# Patient Record
Sex: Male | Born: 1945 | Race: White | Hispanic: No | Smoking: Never smoker
Health system: Southern US, Community
[De-identification: ages and names within clinical notes are randomized; demographics above are authoritative.]

## PROBLEM LIST (undated history)

## (undated) DIAGNOSIS — M1990 Unspecified osteoarthritis, unspecified site: Secondary | ICD-10-CM

## (undated) DIAGNOSIS — I10 Essential (primary) hypertension: Secondary | ICD-10-CM

## (undated) DIAGNOSIS — N400 Enlarged prostate without lower urinary tract symptoms: Secondary | ICD-10-CM

## (undated) DIAGNOSIS — E785 Hyperlipidemia, unspecified: Secondary | ICD-10-CM

## (undated) DIAGNOSIS — N281 Cyst of kidney, acquired: Secondary | ICD-10-CM

## (undated) DIAGNOSIS — C801 Malignant (primary) neoplasm, unspecified: Secondary | ICD-10-CM

## (undated) HISTORY — PX: TONSILLECTOMY: SUR1361

## (undated) HISTORY — DX: Unspecified osteoarthritis, unspecified site: M19.90

## (undated) HISTORY — PX: OTHER SURGICAL HISTORY: SHX169

## (undated) HISTORY — DX: Cyst of kidney, acquired: N28.1

## (undated) HISTORY — PX: CERVICAL FUSION: SHX112

## (undated) HISTORY — DX: Malignant (primary) neoplasm, unspecified: C80.1

## (undated) HISTORY — DX: Benign prostatic hyperplasia without lower urinary tract symptoms: N40.0

## (undated) HISTORY — DX: Hyperlipidemia, unspecified: E78.5

## (undated) HISTORY — DX: Essential (primary) hypertension: I10

---

## 2004-04-22 ENCOUNTER — Ambulatory Visit (HOSPITAL_COMMUNITY): Admission: RE | Admit: 2004-04-22 | Discharge: 2004-04-22 | Payer: Self-pay | Admitting: Gastroenterology

## 2006-02-03 ENCOUNTER — Ambulatory Visit: Payer: Self-pay | Admitting: Internal Medicine

## 2006-02-03 LAB — CONVERTED CEMR LAB
BUN: 11 mg/dL (ref 6–23)
Basophils Absolute: 0 10*3/uL (ref 0.0–0.1)
CO2: 30 meq/L (ref 19–32)
Calcium: 9.1 mg/dL (ref 8.4–10.5)
Cholesterol: 151 mg/dL (ref 0–200)
Creatinine, Ser: 1.1 mg/dL (ref 0.4–1.5)
Eosinophils Relative: 2.5 % (ref 0.0–5.0)
Hemoglobin: 15.4 g/dL (ref 13.0–17.0)
Lymphocytes Relative: 22.2 % (ref 12.0–46.0)
MCHC: 33.1 g/dL (ref 30.0–36.0)
Monocytes Relative: 7.4 % (ref 3.0–11.0)
Neutrophils Relative %: 67.7 % (ref 43.0–77.0)
Potassium: 4 meq/L (ref 3.5–5.1)
TSH: 1.17 microintl units/mL (ref 0.35–5.50)
Total CHOL/HDL Ratio: 3.9
Total Protein: 6.7 g/dL (ref 6.0–8.3)
Triglycerides: 72 mg/dL (ref 0–149)

## 2006-05-09 ENCOUNTER — Emergency Department (HOSPITAL_COMMUNITY): Admission: EM | Admit: 2006-05-09 | Discharge: 2006-05-09 | Payer: Self-pay | Admitting: Emergency Medicine

## 2006-06-11 DIAGNOSIS — Z9089 Acquired absence of other organs: Secondary | ICD-10-CM | POA: Insufficient documentation

## 2006-06-11 DIAGNOSIS — Z8719 Personal history of other diseases of the digestive system: Secondary | ICD-10-CM

## 2006-06-11 DIAGNOSIS — E785 Hyperlipidemia, unspecified: Secondary | ICD-10-CM

## 2006-06-11 DIAGNOSIS — S6990XA Unspecified injury of unspecified wrist, hand and finger(s), initial encounter: Secondary | ICD-10-CM | POA: Insufficient documentation

## 2006-06-11 DIAGNOSIS — S6980XA Other specified injuries of unspecified wrist, hand and finger(s), initial encounter: Secondary | ICD-10-CM

## 2009-10-02 ENCOUNTER — Observation Stay: Payer: Self-pay | Admitting: Internal Medicine

## 2009-10-15 ENCOUNTER — Emergency Department: Payer: Self-pay | Admitting: Emergency Medicine

## 2009-11-21 ENCOUNTER — Ambulatory Visit: Payer: Self-pay | Admitting: Urology

## 2013-04-26 ENCOUNTER — Ambulatory Visit: Payer: Self-pay | Admitting: Urology

## 2014-05-22 DIAGNOSIS — R7309 Other abnormal glucose: Secondary | ICD-10-CM | POA: Diagnosis not present

## 2014-05-22 DIAGNOSIS — I10 Essential (primary) hypertension: Secondary | ICD-10-CM | POA: Diagnosis not present

## 2014-05-22 DIAGNOSIS — E780 Pure hypercholesterolemia: Secondary | ICD-10-CM | POA: Diagnosis not present

## 2014-05-31 DIAGNOSIS — E780 Pure hypercholesterolemia: Secondary | ICD-10-CM | POA: Diagnosis not present

## 2014-05-31 DIAGNOSIS — Z0000 Encounter for general adult medical examination without abnormal findings: Secondary | ICD-10-CM | POA: Diagnosis not present

## 2014-05-31 DIAGNOSIS — I10 Essential (primary) hypertension: Secondary | ICD-10-CM | POA: Diagnosis not present

## 2014-06-08 DIAGNOSIS — Z0000 Encounter for general adult medical examination without abnormal findings: Secondary | ICD-10-CM | POA: Diagnosis not present

## 2014-11-29 DIAGNOSIS — E7800 Pure hypercholesterolemia, unspecified: Secondary | ICD-10-CM | POA: Diagnosis not present

## 2014-11-29 DIAGNOSIS — I10 Essential (primary) hypertension: Secondary | ICD-10-CM | POA: Diagnosis not present

## 2014-12-04 DIAGNOSIS — I10 Essential (primary) hypertension: Secondary | ICD-10-CM | POA: Diagnosis not present

## 2014-12-04 DIAGNOSIS — E7800 Pure hypercholesterolemia, unspecified: Secondary | ICD-10-CM | POA: Diagnosis not present

## 2014-12-04 DIAGNOSIS — M25552 Pain in left hip: Secondary | ICD-10-CM | POA: Diagnosis not present

## 2015-05-28 DIAGNOSIS — Z125 Encounter for screening for malignant neoplasm of prostate: Secondary | ICD-10-CM | POA: Diagnosis not present

## 2015-05-28 DIAGNOSIS — I10 Essential (primary) hypertension: Secondary | ICD-10-CM | POA: Diagnosis not present

## 2015-05-28 DIAGNOSIS — Z0000 Encounter for general adult medical examination without abnormal findings: Secondary | ICD-10-CM | POA: Diagnosis not present

## 2015-05-28 DIAGNOSIS — E7800 Pure hypercholesterolemia, unspecified: Secondary | ICD-10-CM | POA: Diagnosis not present

## 2015-06-04 DIAGNOSIS — R7303 Prediabetes: Secondary | ICD-10-CM | POA: Diagnosis not present

## 2015-06-04 DIAGNOSIS — Z0000 Encounter for general adult medical examination without abnormal findings: Secondary | ICD-10-CM | POA: Diagnosis not present

## 2015-06-04 DIAGNOSIS — M5416 Radiculopathy, lumbar region: Secondary | ICD-10-CM | POA: Diagnosis not present

## 2015-06-04 DIAGNOSIS — E7800 Pure hypercholesterolemia, unspecified: Secondary | ICD-10-CM | POA: Diagnosis not present

## 2015-06-04 DIAGNOSIS — L989 Disorder of the skin and subcutaneous tissue, unspecified: Secondary | ICD-10-CM | POA: Diagnosis not present

## 2015-06-04 DIAGNOSIS — I10 Essential (primary) hypertension: Secondary | ICD-10-CM | POA: Diagnosis not present

## 2015-06-04 DIAGNOSIS — Z23 Encounter for immunization: Secondary | ICD-10-CM | POA: Diagnosis not present

## 2015-06-11 DIAGNOSIS — Z0000 Encounter for general adult medical examination without abnormal findings: Secondary | ICD-10-CM | POA: Diagnosis not present

## 2015-06-26 DIAGNOSIS — L989 Disorder of the skin and subcutaneous tissue, unspecified: Secondary | ICD-10-CM | POA: Diagnosis not present

## 2015-06-26 DIAGNOSIS — C44612 Basal cell carcinoma of skin of right upper limb, including shoulder: Secondary | ICD-10-CM | POA: Diagnosis not present

## 2015-11-01 IMAGING — CR DG ABDOMEN 1V
1 series · 2 of 2 positions shown · non-contrast
Comparison: CT abdomen and pelvis 11/21/2009

CLINICAL DATA: History kidney stones

EXAM:
ABDOMEN - 1 VIEW

[Series 1: t bladder spot · 0.14mm/px · 2 of 2 slices shown]
[im 1/2]
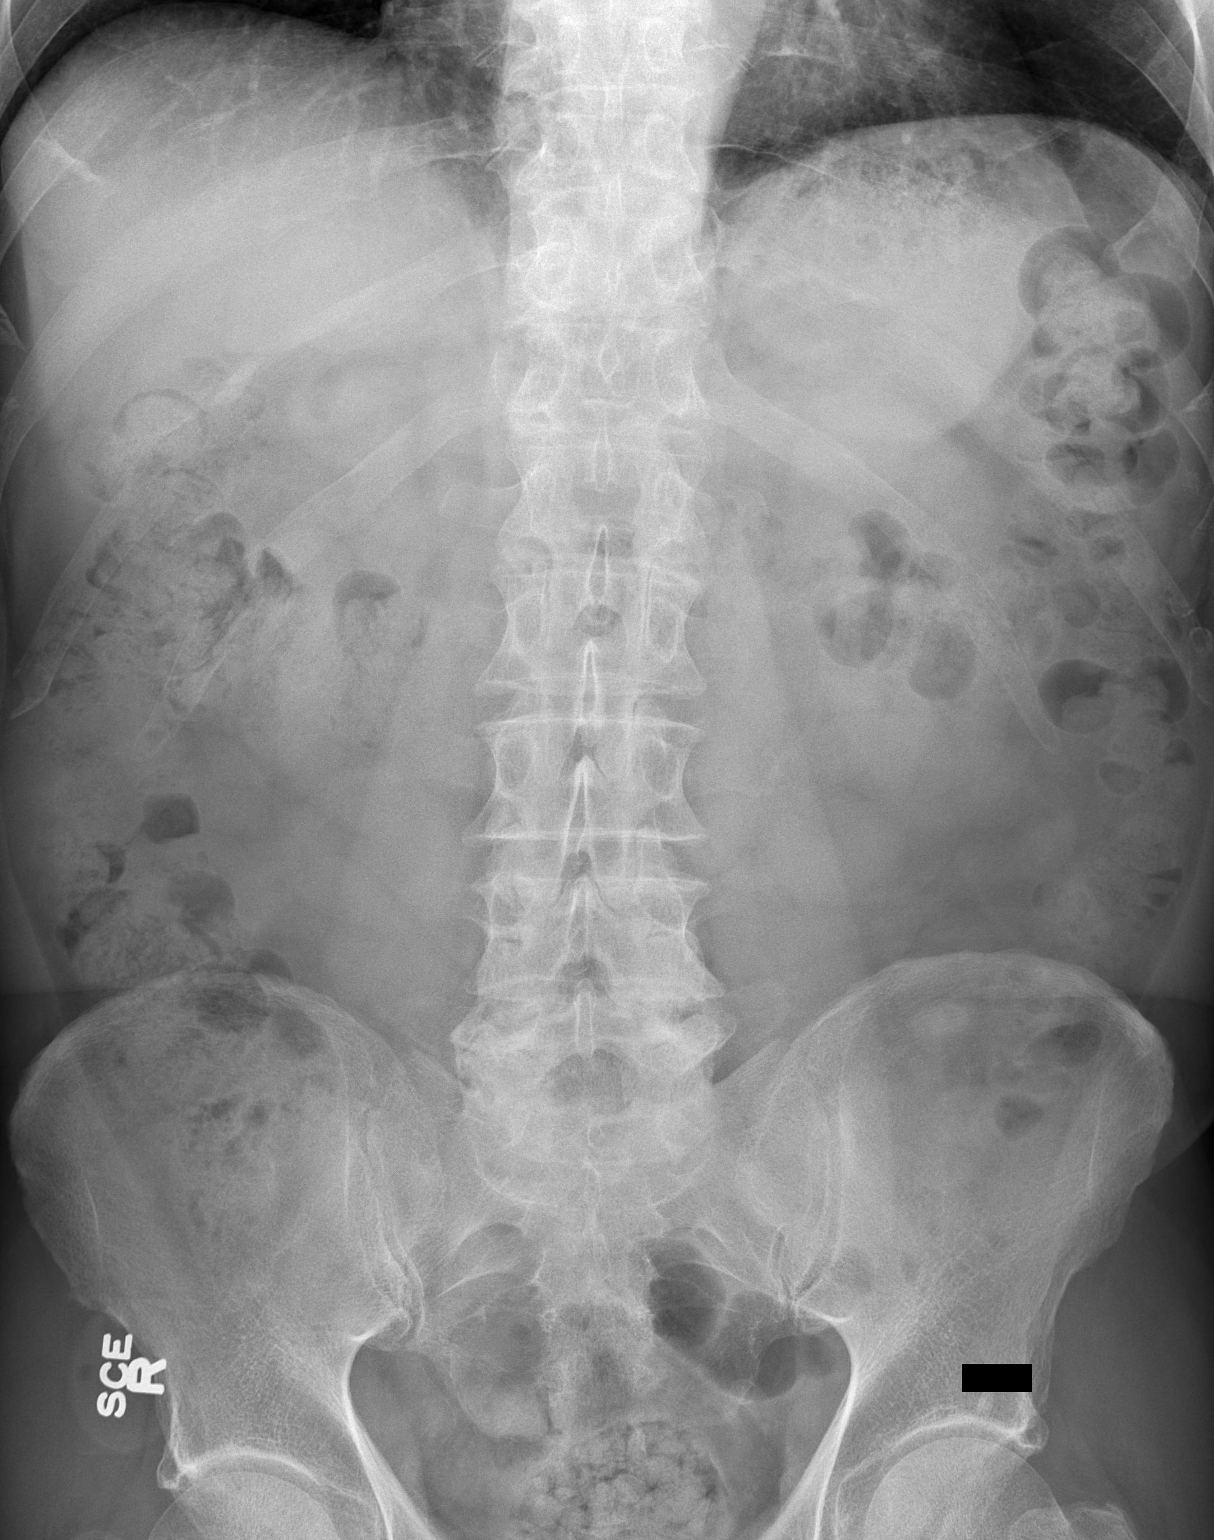
[im 2/2]
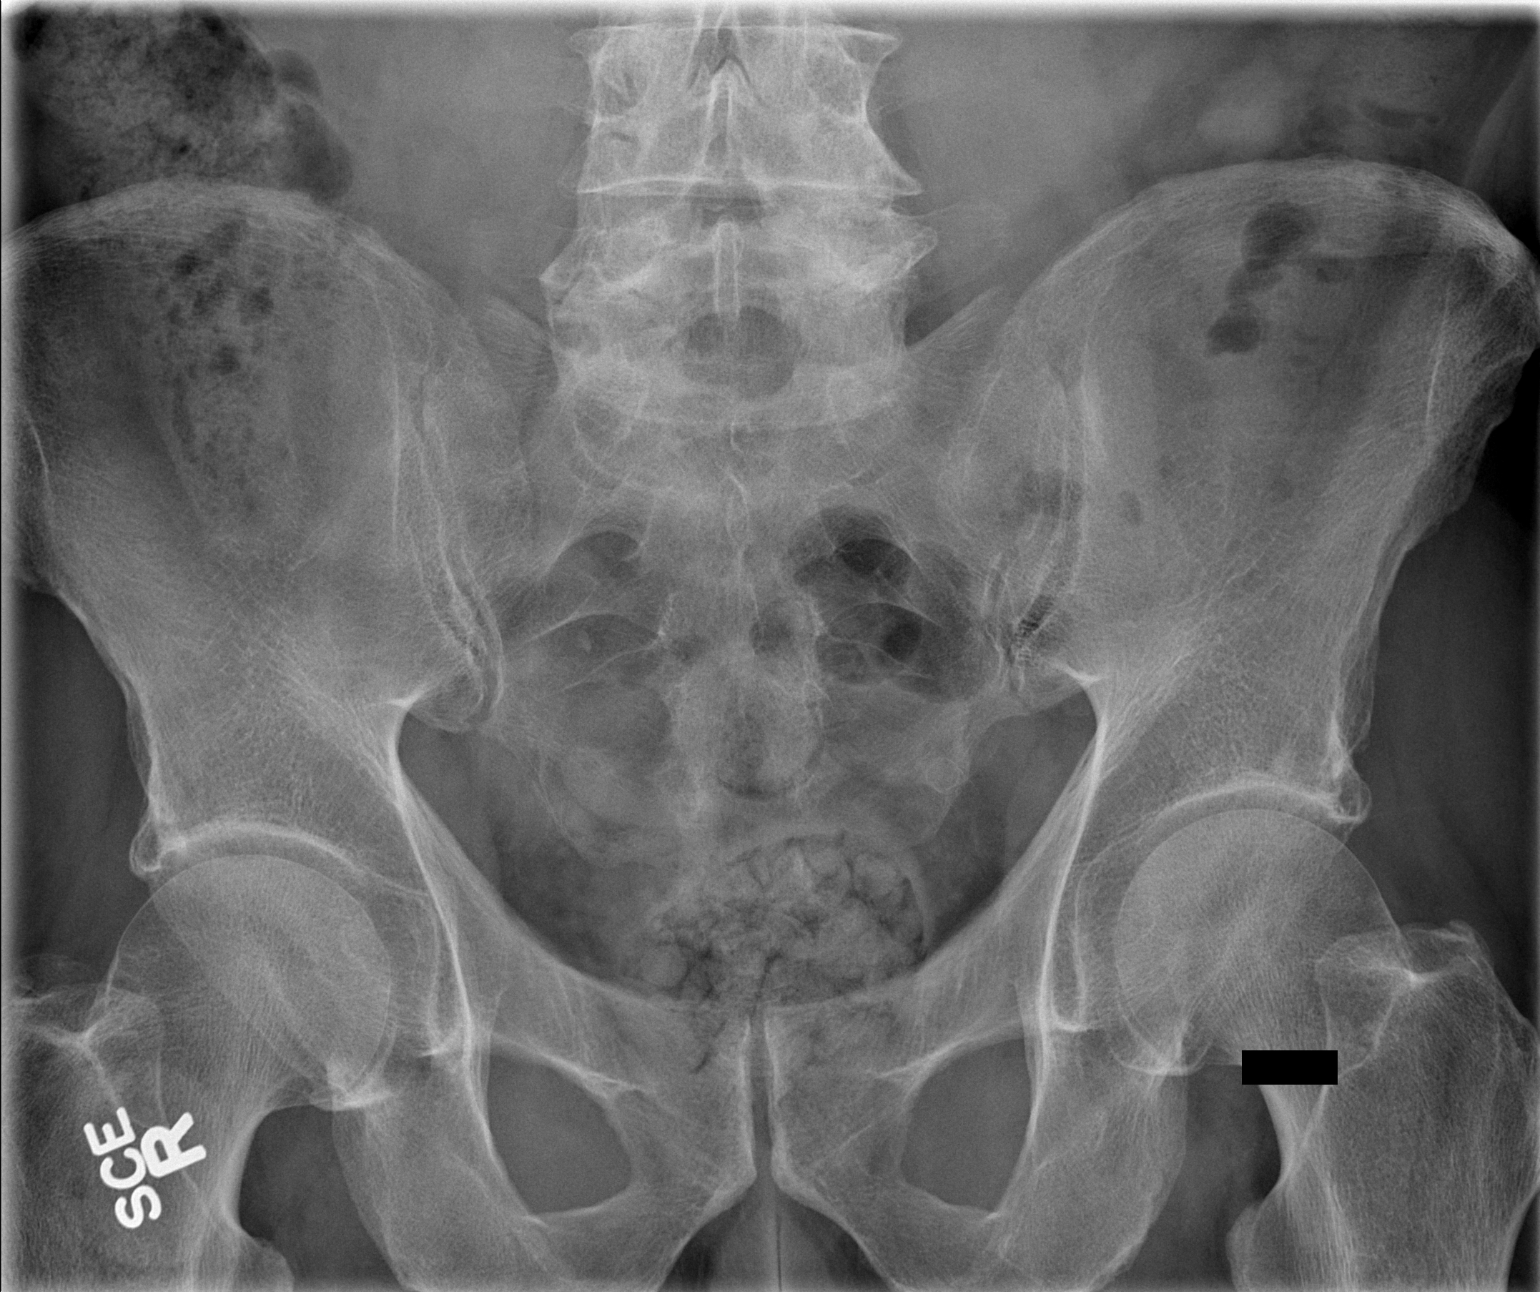

[2 of 2 positions shown; findings below may reference images not displayed]

FINDINGS: Normal bowel gas pattern.

No definite urinary tract calcification.

Osseous structures appear demineralized with degenerative facet
disease changes lower lumbar spine.

Small vascular calcification projects over right sacrum.
IMPRESSION: No urinary tract calcifications identified.

## 2015-11-12 DIAGNOSIS — E7800 Pure hypercholesterolemia, unspecified: Secondary | ICD-10-CM | POA: Diagnosis not present

## 2015-11-12 DIAGNOSIS — R7303 Prediabetes: Secondary | ICD-10-CM | POA: Diagnosis not present

## 2015-11-19 DIAGNOSIS — M5416 Radiculopathy, lumbar region: Secondary | ICD-10-CM | POA: Diagnosis not present

## 2015-11-19 DIAGNOSIS — E7800 Pure hypercholesterolemia, unspecified: Secondary | ICD-10-CM | POA: Diagnosis not present

## 2015-11-19 DIAGNOSIS — R7303 Prediabetes: Secondary | ICD-10-CM | POA: Diagnosis not present

## 2015-11-19 DIAGNOSIS — I10 Essential (primary) hypertension: Secondary | ICD-10-CM | POA: Diagnosis not present

## 2016-06-21 DIAGNOSIS — S0990XA Unspecified injury of head, initial encounter: Secondary | ICD-10-CM | POA: Diagnosis not present

## 2016-06-21 DIAGNOSIS — S00432A Contusion of left ear, initial encounter: Secondary | ICD-10-CM | POA: Diagnosis not present

## 2016-06-21 DIAGNOSIS — S129XXA Fracture of neck, unspecified, initial encounter: Secondary | ICD-10-CM | POA: Diagnosis not present

## 2016-06-21 DIAGNOSIS — X58XXXA Exposure to other specified factors, initial encounter: Secondary | ICD-10-CM | POA: Diagnosis not present

## 2016-06-21 DIAGNOSIS — S0083XA Contusion of other part of head, initial encounter: Secondary | ICD-10-CM | POA: Diagnosis not present

## 2016-06-21 DIAGNOSIS — S069X9A Unspecified intracranial injury with loss of consciousness of unspecified duration, initial encounter: Secondary | ICD-10-CM | POA: Diagnosis not present

## 2016-06-21 DIAGNOSIS — Z79899 Other long term (current) drug therapy: Secondary | ICD-10-CM | POA: Diagnosis not present

## 2016-06-21 DIAGNOSIS — W19XXXA Unspecified fall, initial encounter: Secondary | ICD-10-CM | POA: Diagnosis not present

## 2016-06-21 DIAGNOSIS — S12101A Unspecified nondisplaced fracture of second cervical vertebra, initial encounter for closed fracture: Secondary | ICD-10-CM | POA: Diagnosis not present

## 2016-06-21 DIAGNOSIS — Z5181 Encounter for therapeutic drug level monitoring: Secondary | ICD-10-CM | POA: Diagnosis not present

## 2016-06-21 DIAGNOSIS — W11XXXA Fall on and from ladder, initial encounter: Secondary | ICD-10-CM | POA: Diagnosis not present

## 2016-06-21 DIAGNOSIS — M47892 Other spondylosis, cervical region: Secondary | ICD-10-CM | POA: Diagnosis not present

## 2016-06-21 DIAGNOSIS — S12100A Unspecified displaced fracture of second cervical vertebra, initial encounter for closed fracture: Secondary | ICD-10-CM | POA: Diagnosis not present

## 2016-06-21 DIAGNOSIS — S12090A Other displaced fracture of first cervical vertebra, initial encounter for closed fracture: Secondary | ICD-10-CM | POA: Diagnosis not present

## 2016-06-21 DIAGNOSIS — S12000A Unspecified displaced fracture of first cervical vertebra, initial encounter for closed fracture: Secondary | ICD-10-CM | POA: Diagnosis not present

## 2016-07-10 DIAGNOSIS — S12090D Other displaced fracture of first cervical vertebra, subsequent encounter for fracture with routine healing: Secondary | ICD-10-CM | POA: Diagnosis not present

## 2016-07-10 DIAGNOSIS — S12121A Other nondisplaced dens fracture, initial encounter for closed fracture: Secondary | ICD-10-CM | POA: Diagnosis not present

## 2016-07-10 DIAGNOSIS — S12121D Other nondisplaced dens fracture, subsequent encounter for fracture with routine healing: Secondary | ICD-10-CM | POA: Diagnosis not present

## 2016-07-10 DIAGNOSIS — S12090A Other displaced fracture of first cervical vertebra, initial encounter for closed fracture: Secondary | ICD-10-CM | POA: Diagnosis not present

## 2016-07-17 DIAGNOSIS — S12091D Other nondisplaced fracture of first cervical vertebra, subsequent encounter for fracture with routine healing: Secondary | ICD-10-CM | POA: Diagnosis not present

## 2016-07-17 DIAGNOSIS — S12110A Anterior displaced Type II dens fracture, initial encounter for closed fracture: Secondary | ICD-10-CM | POA: Diagnosis not present

## 2016-07-17 DIAGNOSIS — S12090D Other displaced fracture of first cervical vertebra, subsequent encounter for fracture with routine healing: Secondary | ICD-10-CM | POA: Diagnosis not present

## 2016-07-17 DIAGNOSIS — S12120D Other displaced dens fracture, subsequent encounter for fracture with routine healing: Secondary | ICD-10-CM | POA: Diagnosis not present

## 2016-07-17 DIAGNOSIS — I6523 Occlusion and stenosis of bilateral carotid arteries: Secondary | ICD-10-CM | POA: Diagnosis not present

## 2016-07-17 DIAGNOSIS — X58XXXA Exposure to other specified factors, initial encounter: Secondary | ICD-10-CM | POA: Diagnosis not present

## 2016-07-28 DIAGNOSIS — I10 Essential (primary) hypertension: Secondary | ICD-10-CM | POA: Diagnosis not present

## 2016-07-28 DIAGNOSIS — S12111S Posterior displaced Type II dens fracture, sequela: Secondary | ICD-10-CM | POA: Diagnosis not present

## 2016-07-28 DIAGNOSIS — S12091A Other nondisplaced fracture of first cervical vertebra, initial encounter for closed fracture: Secondary | ICD-10-CM | POA: Diagnosis not present

## 2016-07-28 DIAGNOSIS — S12120S Other displaced dens fracture, sequela: Secondary | ICD-10-CM | POA: Diagnosis not present

## 2016-07-28 DIAGNOSIS — M532X2 Spinal instabilities, cervical region: Secondary | ICD-10-CM | POA: Diagnosis not present

## 2016-07-28 DIAGNOSIS — Z89022 Acquired absence of left finger(s): Secondary | ICD-10-CM | POA: Diagnosis not present

## 2016-07-28 DIAGNOSIS — R7303 Prediabetes: Secondary | ICD-10-CM | POA: Diagnosis not present

## 2016-07-28 DIAGNOSIS — S12091S Other nondisplaced fracture of first cervical vertebra, sequela: Secondary | ICD-10-CM | POA: Diagnosis not present

## 2016-07-28 DIAGNOSIS — W11XXXS Fall on and from ladder, sequela: Secondary | ICD-10-CM | POA: Diagnosis not present

## 2016-07-28 DIAGNOSIS — S12091D Other nondisplaced fracture of first cervical vertebra, subsequent encounter for fracture with routine healing: Secondary | ICD-10-CM | POA: Diagnosis not present

## 2016-07-28 DIAGNOSIS — S12001S Unspecified nondisplaced fracture of first cervical vertebra, sequela: Secondary | ICD-10-CM | POA: Diagnosis not present

## 2016-07-28 DIAGNOSIS — M50322 Other cervical disc degeneration at C5-C6 level: Secondary | ICD-10-CM | POA: Diagnosis not present

## 2016-07-28 DIAGNOSIS — Z85828 Personal history of other malignant neoplasm of skin: Secondary | ICD-10-CM | POA: Diagnosis not present

## 2016-07-28 DIAGNOSIS — Z981 Arthrodesis status: Secondary | ICD-10-CM | POA: Diagnosis not present

## 2016-07-28 DIAGNOSIS — R7989 Other specified abnormal findings of blood chemistry: Secondary | ICD-10-CM | POA: Diagnosis not present

## 2016-07-28 DIAGNOSIS — S12191A Other nondisplaced fracture of second cervical vertebra, initial encounter for closed fracture: Secondary | ICD-10-CM | POA: Diagnosis not present

## 2016-07-28 DIAGNOSIS — S12001D Unspecified nondisplaced fracture of first cervical vertebra, subsequent encounter for fracture with routine healing: Secondary | ICD-10-CM | POA: Diagnosis not present

## 2016-07-28 DIAGNOSIS — E7800 Pure hypercholesterolemia, unspecified: Secondary | ICD-10-CM | POA: Diagnosis not present

## 2016-07-28 DIAGNOSIS — Z01818 Encounter for other preprocedural examination: Secondary | ICD-10-CM | POA: Diagnosis not present

## 2016-08-14 DIAGNOSIS — Z981 Arthrodesis status: Secondary | ICD-10-CM | POA: Diagnosis not present

## 2016-09-11 DIAGNOSIS — Z981 Arthrodesis status: Secondary | ICD-10-CM | POA: Diagnosis not present

## 2016-09-11 DIAGNOSIS — M4322 Fusion of spine, cervical region: Secondary | ICD-10-CM | POA: Diagnosis not present

## 2016-12-18 DIAGNOSIS — Z981 Arthrodesis status: Secondary | ICD-10-CM | POA: Diagnosis not present

## 2016-12-18 DIAGNOSIS — S12112S Nondisplaced Type II dens fracture, sequela: Secondary | ICD-10-CM | POA: Diagnosis not present

## 2016-12-18 DIAGNOSIS — M50321 Other cervical disc degeneration at C4-C5 level: Secondary | ICD-10-CM | POA: Diagnosis not present

## 2016-12-18 DIAGNOSIS — M47812 Spondylosis without myelopathy or radiculopathy, cervical region: Secondary | ICD-10-CM | POA: Diagnosis not present

## 2016-12-18 DIAGNOSIS — S12112D Nondisplaced Type II dens fracture, subsequent encounter for fracture with routine healing: Secondary | ICD-10-CM | POA: Diagnosis not present

## 2016-12-18 DIAGNOSIS — M50323 Other cervical disc degeneration at C6-C7 level: Secondary | ICD-10-CM | POA: Diagnosis not present

## 2018-02-04 ENCOUNTER — Encounter: Payer: Self-pay | Admitting: Internal Medicine

## 2018-02-04 ENCOUNTER — Other Ambulatory Visit: Payer: Self-pay | Admitting: Internal Medicine

## 2018-02-04 ENCOUNTER — Ambulatory Visit (INDEPENDENT_AMBULATORY_CARE_PROVIDER_SITE_OTHER): Payer: Medicare Other | Admitting: Internal Medicine

## 2018-02-04 VITALS — BP 126/68 | HR 68 | Temp 98.0°F | Ht 68.0 in | Wt 152.6 lb

## 2018-02-04 DIAGNOSIS — Z23 Encounter for immunization: Secondary | ICD-10-CM | POA: Diagnosis not present

## 2018-02-04 DIAGNOSIS — E559 Vitamin D deficiency, unspecified: Secondary | ICD-10-CM | POA: Diagnosis not present

## 2018-02-04 DIAGNOSIS — R739 Hyperglycemia, unspecified: Secondary | ICD-10-CM | POA: Diagnosis not present

## 2018-02-04 DIAGNOSIS — E785 Hyperlipidemia, unspecified: Secondary | ICD-10-CM | POA: Diagnosis not present

## 2018-02-04 DIAGNOSIS — I10 Essential (primary) hypertension: Secondary | ICD-10-CM

## 2018-02-04 DIAGNOSIS — Z1159 Encounter for screening for other viral diseases: Secondary | ICD-10-CM

## 2018-02-04 DIAGNOSIS — R7303 Prediabetes: Secondary | ICD-10-CM

## 2018-02-04 DIAGNOSIS — Z13818 Encounter for screening for other digestive system disorders: Secondary | ICD-10-CM

## 2018-02-04 LAB — CBC WITH DIFFERENTIAL/PLATELET
BASOS PCT: 2.3 % (ref 0.0–3.0)
Basophils Absolute: 0.1 10*3/uL (ref 0.0–0.1)
EOS PCT: 3.6 % (ref 0.0–5.0)
Eosinophils Absolute: 0.2 10*3/uL (ref 0.0–0.7)
HEMATOCRIT: 45.3 % (ref 39.0–52.0)
HEMOGLOBIN: 15.8 g/dL (ref 13.0–17.0)
LYMPHS PCT: 25.5 % (ref 12.0–46.0)
Lymphs Abs: 1.2 10*3/uL (ref 0.7–4.0)
MCHC: 34.9 g/dL (ref 30.0–36.0)
MCV: 95.2 fl (ref 78.0–100.0)
Monocytes Absolute: 0.5 10*3/uL (ref 0.1–1.0)
Monocytes Relative: 10.6 % (ref 3.0–12.0)
Neutro Abs: 2.8 10*3/uL (ref 1.4–7.7)
Neutrophils Relative %: 58 % (ref 43.0–77.0)
Platelets: 245 10*3/uL (ref 150.0–400.0)
RBC: 4.76 Mil/uL (ref 4.22–5.81)
RDW: 12.5 % (ref 11.5–15.5)
WBC: 4.8 10*3/uL (ref 4.0–10.5)

## 2018-02-04 LAB — COMPREHENSIVE METABOLIC PANEL
ALT: 13 U/L (ref 0–53)
AST: 21 U/L (ref 0–37)
Albumin: 4.5 g/dL (ref 3.5–5.2)
Alkaline Phosphatase: 64 U/L (ref 39–117)
BUN: 22 mg/dL (ref 6–23)
CALCIUM: 9.5 mg/dL (ref 8.4–10.5)
CHLORIDE: 102 meq/L (ref 96–112)
CO2: 29 meq/L (ref 19–32)
Creatinine, Ser: 1.03 mg/dL (ref 0.40–1.50)
GFR: 70.41 mL/min (ref >60.00)
Glucose, Bld: 110 mg/dL — ABNORMAL HIGH (ref 70–99)
POTASSIUM: 3.8 meq/L (ref 3.5–5.1)
Sodium: 139 mEq/L (ref 135–145)
Total Bilirubin: 1.6 mg/dL — ABNORMAL HIGH (ref 0.2–1.2)
Total Protein: 7 g/dL (ref 6.0–8.3)

## 2018-02-04 LAB — VITAMIN D 25 HYDROXY (VIT D DEFICIENCY, FRACTURES): VITD: 33.96 ng/mL (ref 30.00–100.00)

## 2018-02-04 LAB — HEMOGLOBIN A1C: Hgb A1c MFr Bld: 5.6 % (ref 4.6–6.5)

## 2018-02-04 LAB — LIPID PANEL
CHOL/HDL RATIO: 5
Cholesterol: 184 mg/dL (ref 0–200)
HDL: 38.6 mg/dL — ABNORMAL LOW (ref >39.00)
LDL CALC: 135 mg/dL — AB (ref 0–99)
NonHDL: 145.79
Triglycerides: 55 mg/dL (ref 0.0–149.0)
VLDL: 11 mg/dL (ref 0.0–40.0)

## 2018-02-04 MED ORDER — LOVASTATIN 20 MG PO TABS: 20.0000 mg | ORAL_TABLET | Freq: Every day | ORAL | 3 refills | Status: DC

## 2018-02-04 NOTE — Patient Instructions (Signed)
Tdap Vaccine (Tetanus, Diphtheria and Pertussis): What You Need to Know 1. Why get vaccinated? Tetanus, diphtheria and pertussis are very serious diseases. Tdap vaccine can protect [place] from these diseases. And, Tdap vaccine given to pregnant women can protect newborn babies against pertussis.Marland Kitchen TETANUS (Lockjaw) is rare in the [place] today. It causes painful muscle tightening and stiffness, usually all over the body.  It can lead to tightening of muscles in the head and neck so you can't open your mouth, swallow, or sometimes even breathe. Tetanus kills about 1 out of 10 people who are infected even after receiving the best medical care. DIPHTHERIA is also rare in the [place] today. It can cause a thick coating to form in the back of the throat.  It can lead to breathing problems, heart failure, paralysis, and death. PERTUSSIS (Whooping Cough) causes severe coughing spells, which can cause difficulty breathing, vomiting and disturbed sleep.  It can also lead to weight loss, incontinence, and rib fractures. Up to 2 in 100 adolescents and 5 in 100 adults with pertussis are hospitalized or have complications, which could include pneumonia or death. These diseases are caused by bacteria. Diphtheria and pertussis are spread from person to person through secretions from coughing or sneezing. Tetanus enters the body through cuts, scratches, or wounds. Before vaccines, as many as 200,000 cases of diphtheria, 200,000 cases of pertussis, and hundreds of cases of tetanus, were reported in the [place] each year. Since vaccination began, reports of cases for tetanus and diphtheria have dropped by about 99% and for pertussis by about 80%. 2. Tdap vaccine Tdap vaccine can protect adolescents and adults from tetanus, diphtheria, and pertussis. One dose of Tdap is routinely given at age 75 or 77. People who did not get Tdap at that age should get it as soon as possible. Tdap is especially important  for healthcare professionals and anyone having close contact with a baby younger than 12 months. Pregnant women should get a dose of Tdap during every pregnancy, to protect the newborn from pertussis. Infants are most at risk for severe, life-threatening complications from pertussis. Another vaccine, called Td, protects against tetanus and diphtheria, but not pertussis. A Td booster should be given every 10 years. Tdap may be given as one of these boosters if you have never gotten Tdap before. Tdap may also be given after a severe cut or burn to prevent tetanus infection. Your doctor or the person giving you the vaccine can give you more information. Tdap may safely be given at the same time as other vaccines. 3. Some people should not get this vaccine  A person who has ever had a life-threatening allergic reaction after a previous dose of any diphtheria, tetanus or pertussis containing vaccine, OR has a severe allergy to any part of this vaccine, should not get Tdap vaccine. Tell the person giving the vaccine about any severe allergies.  Anyone who had coma or long repeated seizures within 7 days after a childhood dose of DTP or DTaP, or a previous dose of Tdap, should not get Tdap, unless a cause other than the vaccine was found. They can still get Td.  Talk to your doctor if you: ? have seizures or another nervous system problem, ? had severe pain or swelling after any vaccine containing diphtheria, tetanus or pertussis, ? ever had a condition called Guillain-Barr Syndrome (GBS), ? aren't feeling well on the day the shot is scheduled. 4. Risks With any medicine, including vaccines, there is  a chance of side effects. These are usually mild and go away on their own. Serious reactions are also possible but are rare. Most people who get Tdap vaccine do not have any problems with it. Mild problems following Tdap (Did not interfere with activities)  Pain where the shot was given (about 3 in 4  adolescents or 2 in 3 adults)  Redness or swelling where the shot was given (about 1 person in 5)  Mild fever of at least 100.40F (up to about 1 in 25 adolescents or 1 in 100 adults)  Headache (about 3 or 4 people in 10)  Tiredness (about 1 person in 3 or 4)  Nausea, vomiting, diarrhea, stomach ache (up to 1 in 4 adolescents or 1 in 10 adults)  Chills, sore joints (about 1 person in 10)  Body aches (about 1 person in 3 or 4)  Rash, swollen glands (uncommon) Moderate problems following Tdap (Interfered with activities, but did not require medical attention)  Pain where the shot was given (up to 1 in 5 or 6)  Redness or swelling where the shot was given (up to about 1 in 16 adolescents or 1 in 12 adults)  Fever over 102F (about 1 in 100 adolescents or 1 in 250 adults)  Headache (about 1 in 7 adolescents or 1 in 10 adults)  Nausea, vomiting, diarrhea, stomach ache (up to 1 or 3 people in 100)  Swelling of the entire arm where the shot was given (up to about 1 in 500). Severe problems following Tdap (Unable to perform usual activities; required medical attention)  Swelling, severe pain, bleeding and redness in the arm where the shot was given (rare). Problems that could happen after any vaccine:  People sometimes faint after a medical procedure, including vaccination. Sitting or lying down for about 15 minutes can help prevent fainting, and injuries caused by a fall. Tell your doctor if you feel dizzy, or have vision changes or ringing in the ears.  Some people get severe pain in the shoulder and have difficulty moving the arm where a shot was given. This happens very rarely.  Any medication can cause a severe allergic reaction. Such reactions from a vaccine are very rare, estimated at fewer than 1 in a million doses, and would happen within a few minutes to a few hours after the vaccination. As with any medicine, there is a very remote chance of a vaccine causing a serious  injury or death. The safety of vaccines is always being monitored. For more information, visit: http://www.aguilar.org/ 5. What if there is a serious problem? What should I look for?  Look for anything that concerns you, such as signs of a severe allergic reaction, very high fever, or unusual behavior. Signs of a severe allergic reaction can include hives, swelling of the face and throat, difficulty breathing, a fast heartbeat, dizziness, and weakness. These would usually start a few minutes to a few hours after the vaccination. What should I do?  If you think it is a severe allergic reaction or other emergency that can't wait, call 9-1-1 or get the person to the nearest hospital. Otherwise, call your doctor.  Afterward, the reaction should be reported to the Vaccine Adverse Event Reporting System (VAERS). Your doctor might file this report, or you can do it yourself through the VAERS web site at www.vaers.SamedayNews.es, or by calling 302-685-4097. VAERS does not give medical advice. 6. The National Vaccine Injury Compensation Program The [employer] Vaccine Injury Compensation Program ([place]) is  call your doctor.  · Afterward, the reaction should be reported to the Vaccine Adverse Event Reporting System (VAERS). Your doctor might file this report, or you can do it yourself through the VAERS web site at www.vaers.hhs.gov, or by calling 1-800-822-7967.  VAERS does not give medical advice.  6. The National Vaccine Injury Compensation Program  The National Vaccine Injury Compensation Program (VICP) is a federal program that was created to compensate people who may have been injured by certain vaccines.  Persons who believe they may have been injured by a vaccine can learn about the program and about filing a claim by calling 1-800-338-2382 or visiting the VICP website at www.hrsa.gov/vaccinecompensation. There is a time limit to file a claim for compensation.  7. How can I learn more?  · Ask your doctor. He or she can give you the vaccine package insert or suggest other sources of information.  · Call your local or state health department.  · Contact the Centers for Disease Control and Prevention (CDC):  ? Call 1-800-232-4636 (1-800-CDC-INFO) or  ? Visit CDC's website at www.cdc.gov/vaccines  Vaccine Information Statement Tdap Vaccine (03/14/2013)  This information is not intended to replace advice given to you  by your health care provider. Make sure you discuss any questions you have with your health care provider.  Document Released: 07/07/2011 Document Revised: 08/23/2017 Document Reviewed: 08/23/2017  Elsevier Interactive Patient Education © 2019 Elsevier Inc.  Recombinant Zoster (Shingles) Vaccine, RZV: What You Need to Know  1. Why get vaccinated?  Shingles (also called herpes zoster, or just zoster) is a painful skin rash, often with blisters. Shingles is caused by the varicella zoster virus, the same virus that causes chickenpox. After you have chickenpox, the virus stays in your body and can cause shingles later in life.  You can't catch shingles from another person. However, a person who has never had chickenpox (or chickenpox vaccine) could get chickenpox from someone with shingles.  A shingles rash usually appears on one side of the face or body and heals within 2 to 4 weeks. Its main symptom is pain, which can be severe. Other symptoms can include fever, headache, chills, and upset stomach. Very rarely, a shingles infection can lead to pneumonia, hearing problems, blindness, brain inflammation (encephalitis), or death.  For about 1 person in 5, severe pain can continue even long after the rash has cleared up. This long-lasting pain is called post-herpetic neuralgia (PHN).  Shingles is far more common in people 50 years of age and older than in younger people, and the risk increases with age. It is also more common in people whose immune system is weakened because of a disease such as cancer, or by drugs such as steroids or chemotherapy.  At least 1 million people a year in the [place] get shingles.  2. Shingles vaccine (recombinant)  Recombinant shingles vaccine was approved by FDA in 2017 for the prevention of shingles. In clinical trials, it was more than 90% effective in preventing shingles. It can also reduce the likelihood of PHN.  Two doses, 2 to 6 months apart, are recommended for adults 50 and  older.  This vaccine is also recommended for people who have already gotten the live shingles vaccine (Zostavax). There is no live virus in this vaccine.  3. Some people should not get this vaccine  Tell your vaccine provider if you:  · Have any severe, life-threatening allergies. A person who has ever had a life-threatening allergic reaction after a dose   of recombinant shingles vaccine, or has a severe allergy to any component of this vaccine, may be advised not to be vaccinated. Ask your health care provider if you want information about vaccine components.  · Are pregnant or breastfeeding. There is not much information about use of recombinant shingles vaccine in pregnant or nursing women. Your healthcare provider might recommend delaying vaccination.  · Are not feeling well. If you have a mild illness, such as a cold, you can probably get the vaccine today. If you are moderately or severely ill, you should probably wait until you recover. Your doctor can advise you.  4. Risks of a vaccine reaction  With any medicine, including vaccines, there is a chance of reactions.  After recombinant shingles vaccination, a person might experience:  · Pain, redness, soreness, or swelling at the site of the injection  · Headache, muscle aches, fever, shivering, fatigue  In clinical trials, most people got a sore arm with mild or moderate pain after vaccination, and some also had redness and swelling where they got the shot. Some people felt tired, had muscle pain, a headache, shivering, fever, stomach pain, or nausea. About 1 out of 6 people who got recombinant zoster vaccine experienced side effects that prevented them from doing regular activities. Symptoms went away on their own in about 2 to 3 days. Side effects were more common in younger people.  You should still get the second dose of recombinant zoster vaccine even if you had one of these reactions after the first dose.  Other things that could happen after this  vaccine:  · People sometimes faint after medical procedures, including vaccination. Sitting or lying down for about 15 minutes can help prevent fainting and injuries caused by a fall. Tell your provider if you feel dizzy or have vision changes or ringing in the ears.  · Some people get shoulder pain that can be more severe and longer-lasting than routine soreness that can follow injections. This happens very rarely.  · Any medication can cause a severe allergic reaction. Such reactions to a vaccine are estimated at about 1 in a million doses, and would happen within a few minutes to a few hours after the vaccination.  As with any medicine, there is a very remote chance of a vaccine causing a serious injury or death.  The safety of vaccines is always being monitored. For more information, visit: www.cdc.gov/vaccinesafety/  5. What if there is a serious problem?  What should I look for?  · Look for anything that concerns you, such as signs of a severe allergic reaction, very high fever, or unusual behavior.  Signs of a severe allergic reaction can include hives, swelling of the face and throat, difficulty breathing, a fast heartbeat, dizziness, and weakness. These would usually start a few minutes to a few hours after the vaccination.  What should I do?  · If you think it is a severe allergic reaction or other emergency that can't wait, call 9-1-1 or get to the nearest hospital. Otherwise, call your health care provider.  Afterward, the reaction should be reported to the Vaccine Adverse Event Reporting System (VAERS). Your doctor should file this report, or you can do it yourself through the VAERS website at www.vaers.hhs.gov, or by calling 1-800-822-7967.  VAERS does not give medical advice.  6. How can I learn more?  · Ask your health care provider. He or she can give you the vaccine package insert or suggest other sources of information.  ·

## 2018-02-04 NOTE — Progress Notes (Signed)
Chief Complaint  Patient presents with  . Establish Care   New patient  1. HTN/HLD controlled on enalapril hctz 10-25 and lovastatin 10 mg qhs no complaints due for labs no refills needed    Review of Systems  Constitutional: Negative for weight loss.  HENT: Negative for hearing loss.   Eyes: Negative for blurred vision.  Respiratory: Negative for shortness of breath.   Cardiovascular: Negative for chest pain.  Gastrointestinal: Negative for abdominal pain.  Musculoskeletal: Negative for falls.  Skin: Negative for rash.  Neurological: Negative for headaches.  Psychiatric/Behavioral: Negative for depression and memory loss.   Past Medical History:  Diagnosis Date  . Arthritis    low back   . BPH (benign prostatic hyperplasia)    mild 11/21/09 CT   . Cancer ([place])    [place] right forearm 2017   . Hyperlipidemia   . Hypertension   . Kidney cysts    right 6.6 mm 11/21/09 CT    Past Surgical History:  Procedure Laterality Date  . CERVICAL FUSION     C1/2 s/p fall fall 2018  . right maxillary fracture     1972 s/p repair 1962  . TONSILLECTOMY     1957   Family History  Problem Relation Age of Onset  . Cancer Father        prostate    Social History   Socioeconomic History  . Marital status: Widowed    Spouse name: Not on file  . Number of children: Not on file  . Years of education: Not on file  . Highest education level: Not on file  Occupational History  . Not on file  Social Needs  . Financial resource strain: Not on file  . Food insecurity:    Worry: Not on file    Inability: Not on file  . Transportation needs:    Medical: Not on file    Non-medical: Not on file  Tobacco Use  . Smoking status: Never Smoker  . Smokeless tobacco: Never Used  Substance and Sexual Activity  . Alcohol use: Not Currently  . Drug use: Not Currently  . Sexual activity: Not Currently    Partners: Female  Lifestyle  . Physical activity:    Days per week: Not on file   Minutes per session: Not on file  . Stress: Not on file  Relationships  . Social connections:    Talks on phone: Not on file    Gets together: Not on file    Attends religious service: Not on file    Active member of club or organization: Not on file    Attends meetings of clubs or organizations: Not on file    Relationship status: Not on file  . Intimate partner violence:    Fear of current or ex partner: Not on file    Emotionally abused: Not on file    Physically abused: Not on file    Forced sexual activity: Not on file  Other Topics Concern  . Not on file  Social History Narrative   Married wife Inez Catalina    Never smoker    No guns, wears seat belt, safe in relationship    Retired [employer] degree    Current Meds  Medication Sig  . enalapril-hydrochlorothiazide (VASERETIC) 10-25 MG tablet Take 1 tablet by mouth daily.   Marland Kitchen lovastatin (MEVACOR) 10 MG tablet Take 10 mg by mouth at bedtime.    Allergies  Allergen Reactions  . Atorvastatin Other (See Comments)  Caused muscle aches & GI upset or med did not work  . Ezetimibe Other (See Comments)    Caused muscle aches & GI upset or med did not work  . Lovastatin Other (See Comments)    Caused muscle aches & GI upset or med did not work  . Meloxicam Other (See Comments)    Affected stomach & caused heartburn  . Rosuvastatin Other (See Comments)    Caused muscle aches & GI upset- generic Crestor   No results found for this or any previous visit (from the past 2160 hour(s)). Objective  Body mass index is 23.2 kg/m. Wt Readings from Last 3 Encounters:  02/04/18 152 lb 9.6 oz (69.2 kg)   Temp Readings from Last 3 Encounters:  02/04/18 98 F (36.7 C) (Oral)   BP Readings from Last 3 Encounters:  02/04/18 126/68   Pulse Readings from Last 3 Encounters:  02/04/18 68    Physical Exam Vitals signs and nursing note reviewed.  Constitutional:      Appearance: Normal appearance. He is well-developed, well-groomed and normal  weight.  HENT:     Head: Normocephalic and atraumatic.     Mouth/Throat:     Mouth: Mucous membranes are moist.     Pharynx: Oropharynx is clear.  Eyes:     Conjunctiva/sclera: Conjunctivae normal.     Pupils: Pupils are equal, round, and reactive to light.  Cardiovascular:     Rate and Rhythm: Normal rate and regular rhythm.     Heart sounds: Normal heart sounds.  Pulmonary:     Effort: Pulmonary effort is normal.     Breath sounds: Normal breath sounds.  Skin:    General: Skin is warm and dry.  Neurological:     General: No focal deficit present.     Mental Status: He is alert and oriented to person, place, and time. Mental status is at baseline.     Gait: Gait normal.  Psychiatric:        Attention and Perception: Attention and perception normal.        Mood and Affect: Mood and affect normal.        Speech: Speech normal.        Behavior: Behavior normal. Behavior is cooperative.        Thought Content: Thought content normal.        Cognition and Memory: Cognition and memory normal.        Judgment: Judgment normal.     Assessment   1. HTN/HLD 2. HM Plan   1. Cont meds Enlapril hctz 10-25 mg qd, lovastatin 10 mg qhs  Getting mail order refill when set up  2.  Fasting labs today  Flu shot utd  prevnar utd  Given pna 23 vx today  Rx Tdap and shingrix today   Will schedule AE eye exam 2020  PSA 0.95 06/15/17 FH prostate cancer in dad  Denies repeat colonoscopy states did cologuard try to get record cologuard release signed today Pt declines UA and TSH Never smoker  Consider derm in future h/o BCC right forearm Disc hep C screening in future  Provider: Dr. Olivia Mackie McLean-Scocuzza-Internal Medicine

## 2018-02-04 NOTE — Progress Notes (Signed)
Pre visit review using our clinic review tool, if applicable. No additional management support is needed unless otherwise documented below in the visit note. 

## 2018-02-16 ENCOUNTER — Telehealth: Payer: Self-pay | Admitting: Internal Medicine

## 2018-02-16 DIAGNOSIS — E785 Hyperlipidemia, unspecified: Secondary | ICD-10-CM

## 2018-02-16 NOTE — Telephone Encounter (Signed)
Copied from [place] [phone]. Topic: Quick Communication - See Telephone Encounter >> Feb 16, 2018  8:58 AM Ivar Drape wrote: CRM for notification. See Telephone encounter for: 02/16/18. Patient was asked by the provider to call back in with the following information:  [facility] [address] [place] [address] Ph: [phone] Fax:  [phone]

## 2018-02-16 NOTE — Telephone Encounter (Signed)
I have corrected the pharmacy in [place].

## 2018-02-28 MED ORDER — LOVASTATIN 20 MG PO TABS: 20.0000 mg | ORAL_TABLET | Freq: Every day | ORAL | 0 refills | Status: DC

## 2018-02-28 MED ORDER — LOVASTATIN 20 MG PO TABS: 20.0000 mg | ORAL_TABLET | Freq: Every day | ORAL | 3 refills | Status: DC

## 2018-02-28 MED ORDER — ENALAPRIL-HYDROCHLOROTHIAZIDE 10-25 MG PO TABS: 1.0000 | ORAL_TABLET | Freq: Every day | ORAL | 11 refills | Status: DC

## 2018-02-28 NOTE — Telephone Encounter (Signed)
Medication has been sent to pharmacy and 30 day cholesterol medication has been sent to [facility] on [address].

## 2018-02-28 NOTE — Telephone Encounter (Signed)
Pt states that his medications were never called in after he called back to report his new mail order pharmacy.  Pt states he is only 3 days from running out. Pt needs both enalapril-hydrochlorothiazide (VASERETIC) 10-25 MG tablet and lovastatin (MEVACOR) 20 MG tablet called in.  Pt states that he does need these sent to [facility]  Pt would like a call after these have been called in.  Pt can be reached at [phone]

## 2018-03-08 ENCOUNTER — Telehealth: Payer: Self-pay | Admitting: Internal Medicine

## 2018-03-08 MED ORDER — ENALAPRIL-HYDROCHLOROTHIAZIDE 10-25 MG PO TABS: 1.0000 | ORAL_TABLET | Freq: Every day | ORAL | 1 refills | Status: DC

## 2018-03-08 MED ORDER — LOVASTATIN 20 MG PO TABS: 20.0000 mg | ORAL_TABLET | Freq: Every day | ORAL | 0 refills | Status: DC

## 2018-03-08 MED ORDER — LOVASTATIN 20 MG PO TABS: 20.0000 mg | ORAL_TABLET | Freq: Every day | ORAL | 1 refills | Status: DC

## 2018-03-08 NOTE — Telephone Encounter (Signed)
Per pt request lovastatin sent to local pharmacy [facility]) until mail order arrives.    We will update mail order Rx to reflect the 90 day supply.

## 2018-03-08 NOTE — Addendum Note (Signed)
Addended by: Marijo Conception on: 03/08/2018 02:16 PM   Modules accepted: Orders

## 2018-03-08 NOTE — Telephone Encounter (Addendum)
Pt calling back to inquire why these are not at the local pharmacy.  Pt had stated he needed these sent to local pharmacy, and it was not.  A 30 day [facility]. Pt has been out for 5 days of the  lovastatin (MEVACOR) 20 MG tablet  This is only one he needs locally  [place], [place] - [place] [phone] (Phone) [phone] (Fax)    But pt needs these 2 scripts sent to [facility] as a 90 day, one was sent in 30 with refills, and it will cost him. lovastatin (MEVACOR) 20 MG tablet enalapril-hydrochlorothiazide (VASERETIC) 10-25 MG tablet

## 2018-03-08 NOTE — Telephone Encounter (Signed)
Called pt and let him know that I got his Rx's straightened out.    90 day supply of lovastatin and enalapril-HCTZ sent to [facility].  A 30 day supply of the lovastatin was sent to [facility] to get him by until mail order arrives since he has been out for 5 days.

## 2018-06-15 ENCOUNTER — Other Ambulatory Visit: Payer: Self-pay | Admitting: Internal Medicine

## 2018-06-15 DIAGNOSIS — I10 Essential (primary) hypertension: Secondary | ICD-10-CM

## 2018-06-15 DIAGNOSIS — E785 Hyperlipidemia, unspecified: Secondary | ICD-10-CM

## 2018-06-15 MED ORDER — LOVASTATIN 20 MG PO TABS: 20.0000 mg | ORAL_TABLET | Freq: Every day | ORAL | 3 refills | Status: AC

## 2018-06-15 MED ORDER — ENALAPRIL-HYDROCHLOROTHIAZIDE 10-25 MG PO TABS: 1.0000 | ORAL_TABLET | Freq: Every day | ORAL | 3 refills | Status: AC

## 2018-08-09 ENCOUNTER — Ambulatory Visit: Payer: Self-pay | Admitting: Internal Medicine
